# Patient Record
Sex: Male | Born: 1955 | Race: White | Hispanic: No
Health system: Southern US, Community
[De-identification: ages and names within clinical notes are randomized; demographics above are authoritative.]

---

## 2004-12-19 ENCOUNTER — Encounter: Admission: RE | Admit: 2004-12-19 | Discharge: 2004-12-19 | Payer: Self-pay | Admitting: Specialist

## 2005-01-14 ENCOUNTER — Encounter: Admission: RE | Admit: 2005-01-14 | Discharge: 2005-01-14 | Payer: Self-pay | Admitting: Specialist

## 2019-08-25 ENCOUNTER — Ambulatory Visit: Payer: Self-pay

## 2019-08-26 ENCOUNTER — Other Ambulatory Visit: Payer: Self-pay | Admitting: Family Medicine

## 2019-08-26 DIAGNOSIS — Z136 Encounter for screening for cardiovascular disorders: Secondary | ICD-10-CM

## 2019-08-26 DIAGNOSIS — Z87891 Personal history of nicotine dependence: Secondary | ICD-10-CM

## 2019-08-27 ENCOUNTER — Ambulatory Visit
Admission: RE | Admit: 2019-08-27 | Discharge: 2019-08-27 | Disposition: A | Discharge status: Home / Self Care | Payer: Medicare HMO | Source: Ambulatory Visit | Attending: Family Medicine | Admitting: Family Medicine

## 2019-08-27 DIAGNOSIS — Z87891 Personal history of nicotine dependence: Secondary | ICD-10-CM

## 2019-08-27 DIAGNOSIS — Z136 Encounter for screening for cardiovascular disorders: Secondary | ICD-10-CM

## 2019-09-04 ENCOUNTER — Ambulatory Visit: Payer: Medicare HMO | Attending: Internal Medicine

## 2019-09-04 DIAGNOSIS — Z23 Encounter for immunization: Secondary | ICD-10-CM

## 2019-09-04 NOTE — Progress Notes (Signed)
   Covid-19 Vaccination [facility]  Name:  Khyan Oats    MRN: [id number] DOB: Nov 04, 1955  09/04/2019  Mr. Prichett was observed post Covid-19 immunization for 15 minutes without incidence. He was provided with Vaccine Information Sheet and instruction to access the V-Safe system.   Mr. Sires was instructed to call 911 with any severe reactions post vaccine: Marland Kitchen Difficulty breathing  . Swelling of your face and throat  . A fast heartbeat  . A bad rash all over your body  . Dizziness and weakness    Immunizations Administered    Name Date Dose VIS Date Route   Pfizer COVID-19 Vaccine 09/04/2019  9:49 AM 0.3 mL 07/09/2019 Intramuscular   Manufacturer: [employer], [employer]   Lot: [id number]   NDC: [address]-4

## 2019-09-15 ENCOUNTER — Ambulatory Visit: Payer: Self-pay

## 2019-09-28 ENCOUNTER — Ambulatory Visit: Payer: Medicare HMO | Attending: Internal Medicine

## 2019-09-28 ENCOUNTER — Ambulatory Visit: Payer: Medicare HMO

## 2019-09-28 DIAGNOSIS — Z23 Encounter for immunization: Secondary | ICD-10-CM

## 2019-09-28 NOTE — Progress Notes (Signed)
   Covid-19 Vaccination Clinic  Name:  Drew Jones    MRN: [id number] DOB: 1956/03/23  09/28/2019  [name] was observed post Covid-19 immunization for 15 minutes without incident. He was provided with Vaccine Information Sheet and instruction to access the V-Safe system.   [name] was instructed to call 911 with any severe reactions post vaccine: Marland Kitchen Difficulty breathing  . Swelling of face and throat  . A fast heartbeat  . A bad rash all over body  . Dizziness and weakness   Immunizations Administered    Name Date Dose VIS Date Route   Pfizer COVID-19 Vaccine 09/28/2019  3:02 PM 0.3 mL 07/09/2019 Intramuscular   Manufacturer: [employer], [employer]   Lot: [id number]   NDC: [address]-0

## 2020-12-10 IMAGING — US US ABDOMINAL AORTA SCREENING AAA
1 series · 14 of 21 positions shown · non-contrast
Comparison: None.

CLINICAL DATA: Male between 65-75 years of age with a smoking
history.

EXAM:
US ABDOMINAL AORTA MEDICARE SCREENING
TECHNIQUE: Ultrasound examination of the abdominal aorta was performed as a
screening evaluation for abdominal aortic aneurysm.

[Series 1: us abdominal aorta screening aaa · 0.30mm/px · 14 of 21 slices shown]
[im 1/21]
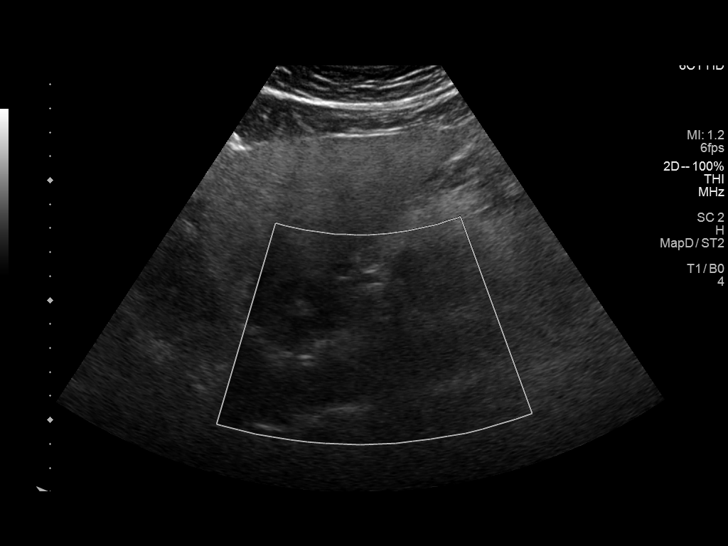
[im 3/21]
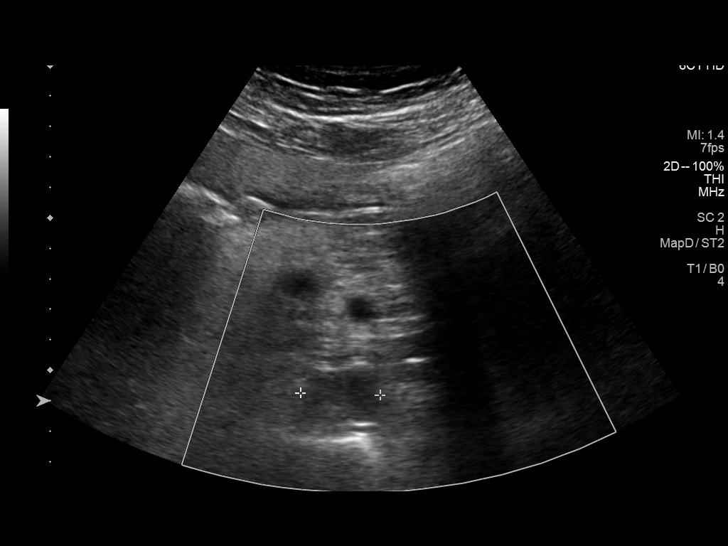
[im 4/21]
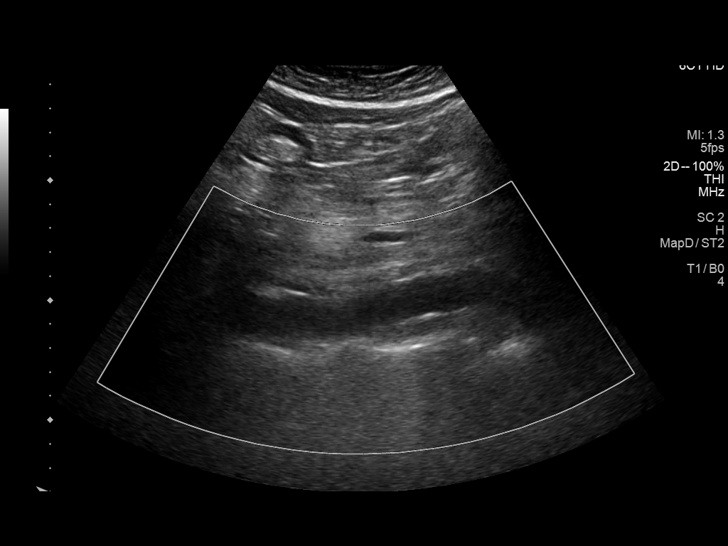
[im 6/21]
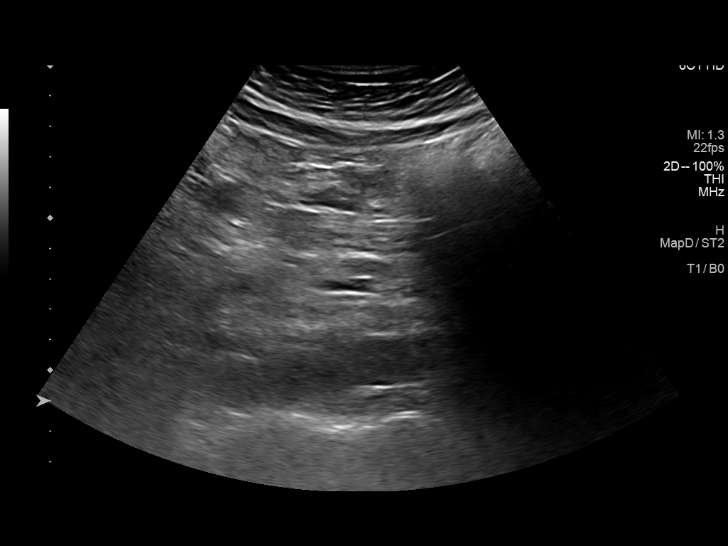
[im 7/21]
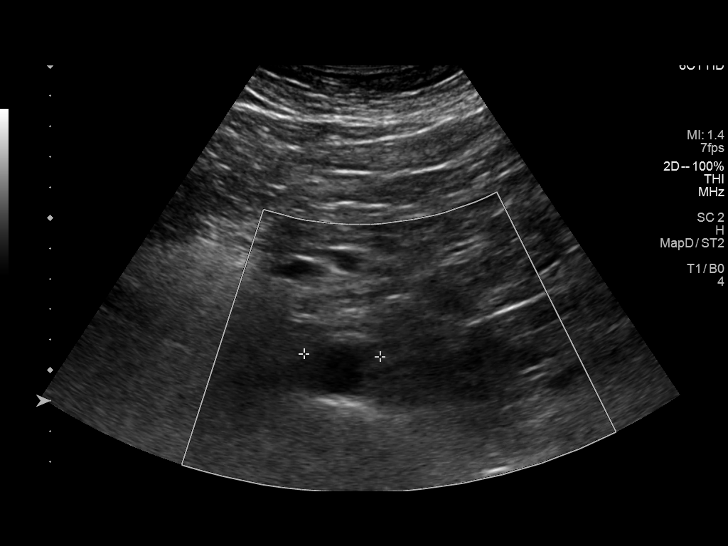
[im 9/21]
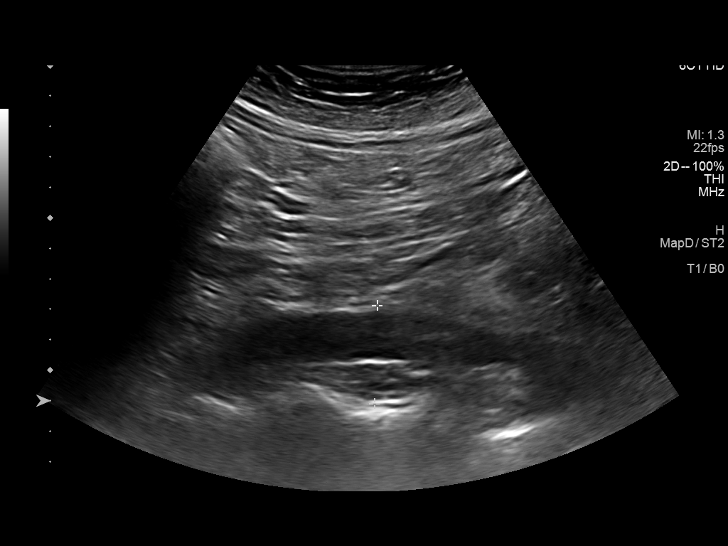
[im 10/21]
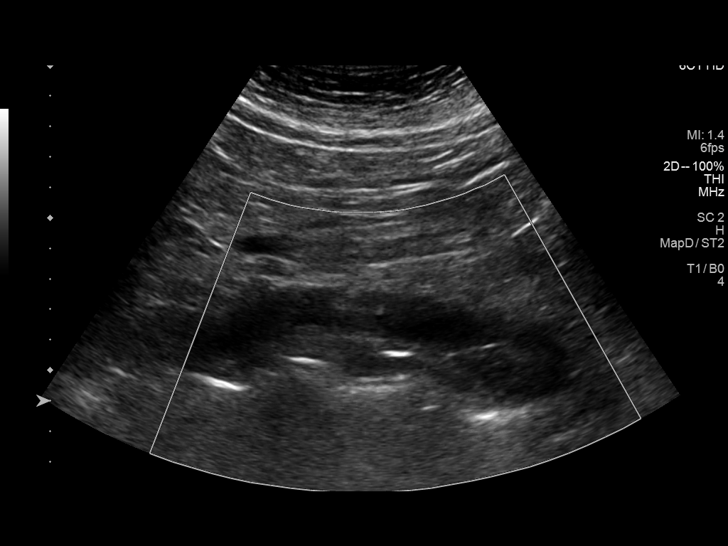
[im 12/21]
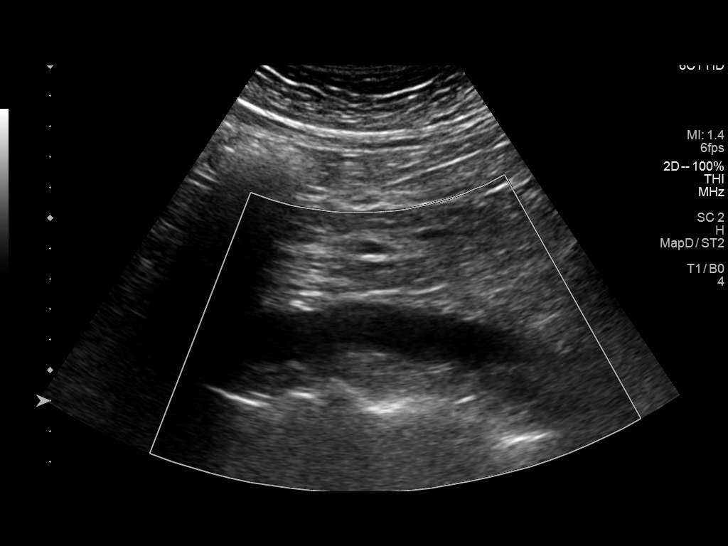
[im 13/21]
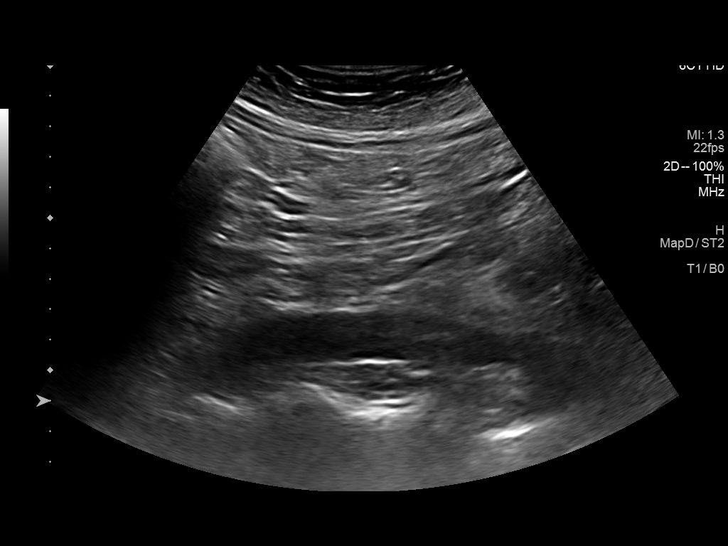
[im 15/21]
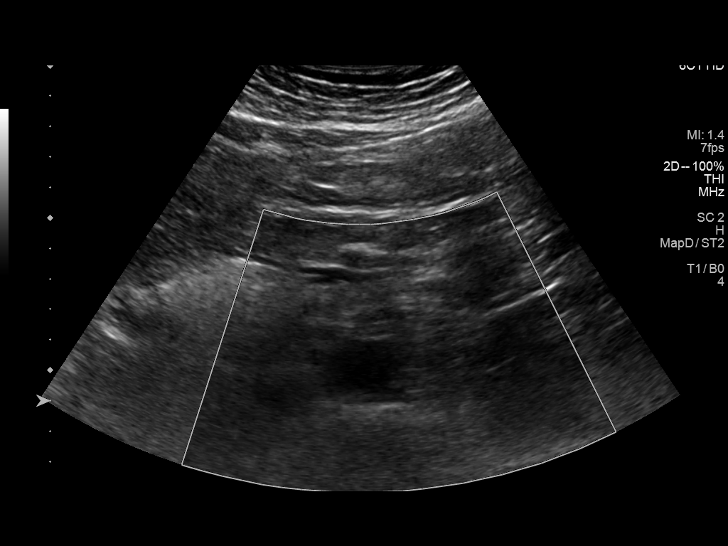
[im 16/21]
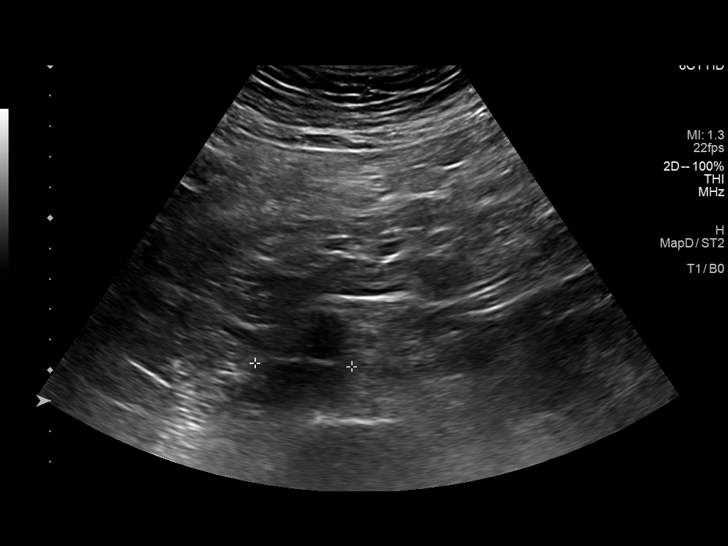
[im 18/21]
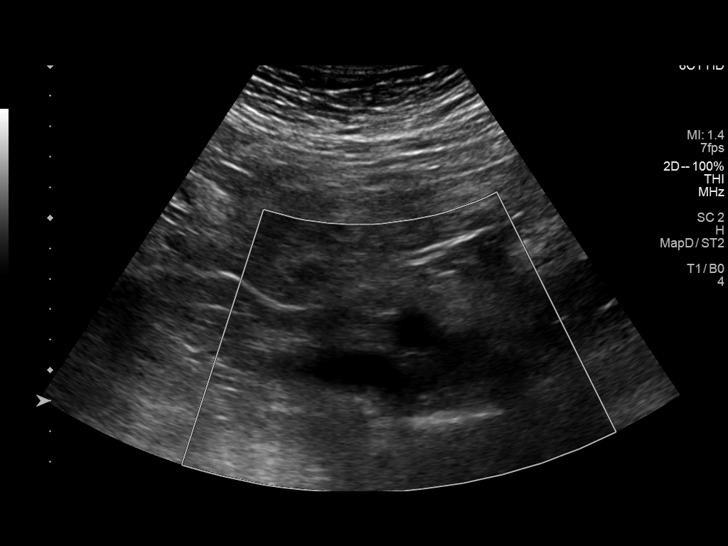
[im 19/21]
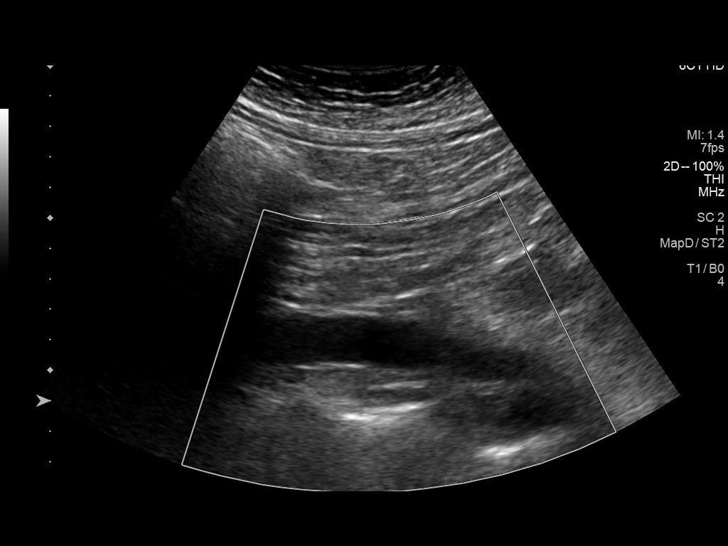
[im 21/21]
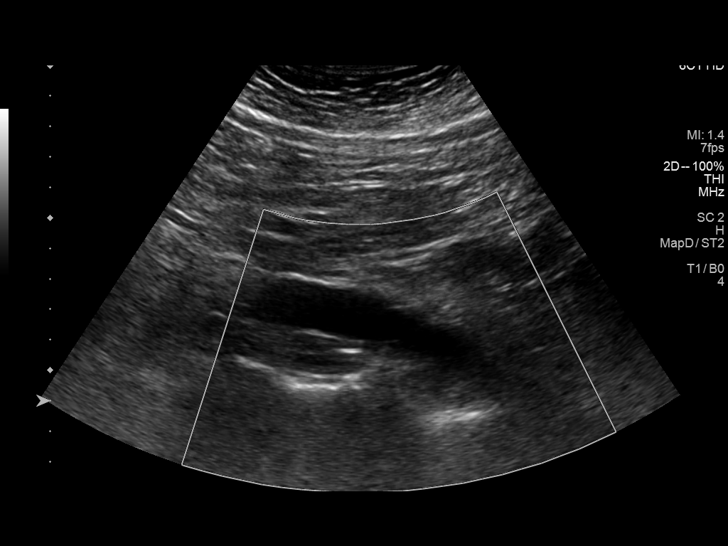

[14 of 21 positions shown; findings below may reference images not displayed]

FINDINGS: Abdominal aortic measurements as follows:

Proximal:  2.5 x 2.6 cm

Mid:  2.5 x 2.5 cm

Distal:  3.2 x 3.2 cm
IMPRESSION: 1. 3.2 cm distal abdominal aortic aneurysm. Recommend followup by US
in 3 years. This recommendation follows ACR consensus guidelines:
White Paper of the ACR Incidental Findings Committee II on Vascular
Findings. [HOSPITAL] 9740; [DATE]

## 2022-05-17 LAB — COLOGUARD: COLOGUARD: NEGATIVE
